# Patient Record
Sex: Female | Born: 1960 | Hispanic: No | Marital: Single | Smoking: Never smoker
Health system: Southern US, Community
[De-identification: ages and names within clinical notes are randomized; demographics above are authoritative.]

## PROBLEM LIST (undated history)

## (undated) DIAGNOSIS — R87619 Unspecified abnormal cytological findings in specimens from cervix uteri: Secondary | ICD-10-CM

## (undated) DIAGNOSIS — IMO0002 Reserved for concepts with insufficient information to code with codable children: Secondary | ICD-10-CM

## (undated) HISTORY — DX: Unspecified abnormal cytological findings in specimens from cervix uteri: R87.619

## (undated) HISTORY — DX: Reserved for concepts with insufficient information to code with codable children: IMO0002

---

## 2002-05-17 ENCOUNTER — Other Ambulatory Visit: Admission: RE | Admit: 2002-05-17 | Discharge: 2002-05-17 | Payer: Self-pay | Admitting: Gynecology

## 2003-05-25 ENCOUNTER — Other Ambulatory Visit: Admission: RE | Admit: 2003-05-25 | Discharge: 2003-05-25 | Payer: Self-pay | Admitting: Gynecology

## 2004-05-27 ENCOUNTER — Other Ambulatory Visit: Admission: RE | Admit: 2004-05-27 | Discharge: 2004-05-27 | Payer: Self-pay | Admitting: Gynecology

## 2005-05-28 ENCOUNTER — Other Ambulatory Visit: Admission: RE | Admit: 2005-05-28 | Discharge: 2005-05-28 | Payer: Self-pay | Admitting: Gynecology

## 2006-05-29 ENCOUNTER — Other Ambulatory Visit: Admission: RE | Admit: 2006-05-29 | Discharge: 2006-05-29 | Payer: Self-pay | Admitting: Gynecology

## 2011-08-19 ENCOUNTER — Ambulatory Visit (HOSPITAL_COMMUNITY)
Admission: RE | Admit: 2011-08-19 | Discharge: 2011-08-19 | Disposition: A | Discharge status: Home / Self Care | Payer: Medicaid Other | Source: Ambulatory Visit | Attending: Obstetrics and Gynecology | Admitting: Obstetrics and Gynecology

## 2011-08-19 ENCOUNTER — Encounter (HOSPITAL_COMMUNITY): Payer: Self-pay

## 2011-08-19 ENCOUNTER — Other Ambulatory Visit: Payer: Self-pay | Admitting: Obstetrics and Gynecology

## 2011-08-19 ENCOUNTER — Ambulatory Visit (INDEPENDENT_AMBULATORY_CARE_PROVIDER_SITE_OTHER): Payer: Self-pay | Admitting: *Deleted

## 2011-08-19 VITALS — BP 125/89 | HR 69 | Temp 97.2°F | Resp 18

## 2011-08-19 DIAGNOSIS — Z1231 Encounter for screening mammogram for malignant neoplasm of breast: Secondary | ICD-10-CM

## 2011-08-19 DIAGNOSIS — Z01419 Encounter for gynecological examination (general) (routine) without abnormal findings: Secondary | ICD-10-CM

## 2011-08-19 DIAGNOSIS — Z124 Encounter for screening for malignant neoplasm of cervix: Secondary | ICD-10-CM

## 2011-08-19 NOTE — Patient Instructions (Signed)
Gave patient education materials on Pap Test and a shower card showing her how to do a BSE. Went over the Insurance underwriter. Taught patient how to perform BSE and when the best time do it. Taught patient what to look for and when to call her physician. Patient verbalized understanding.  Patient was escorted upstairs to mammography to have her mammogram. Told patient will call with results and also will send a letter wither her results. Patient verbalized understanding.

## 2011-08-19 NOTE — Progress Notes (Signed)
No complaints today.  Pap Smear:    Pap Smear Completed. Last Pap was in spring of 2007 per patient. Pap Smear Result was normal per patient.   Physical exam: Breasts      Breasts are symmetrical. No skin abnormality, no nipple retraction, and no nipple discharge observed on exam. No lymphadenopathy, no lumps, and no thickening on palpation.      Pelvic/Bimanual   Ext Genitalia      No lesions, no swelling and no discharge observed on exam.     Vagina    Vagina pink and of normal color. Normal vaginal texture. Vaginal dryness observed on exam. Small amount of spotting red blood after exam.       Cervix      Cervix Present and to the left. Color and texture normal.        Uterus      Uterus Present and palpable. Uterus retroverted and of normal size.      Adnexae      Ovaries Present and not palpable. No tenderness on palpation.    Rectovaginal      Skin normal appearing and no masses observed on visual exam. Rectal exam not completed.

## 2011-08-29 ENCOUNTER — Telehealth: Payer: Self-pay | Admitting: *Deleted

## 2011-08-29 NOTE — Telephone Encounter (Signed)
Called pt and discussed her abnormal pap result and need for colpo. All pt's questions answered. Procedure and appt information given. Pt voiced understanding.

## 2011-08-29 NOTE — Telephone Encounter (Signed)
Message copied by Jill Side on Fri Aug 29, 2011 12:03 PM ------      Message from: Elwin Sleight A      Created: Thu Aug 28, 2011  7:42 AM      Regarding: FW: Needs colpo       This pt has an appt on 10/02/11 @ 1:45 foe a colpo. Please call pt, and answer any questions she may have.                  Thank you,       Antoinette                  ----- Message -----         From: Raynald Blend         Sent: 08/28/2011   7:05 AM           To: Juliette Mangle, RN      Subject: RE: Needs colpo                                          Ok            ----- Message -----         From: Juliette Mangle, RN         Sent: 08/27/2011   2:55 PM           To: Catalina Antigua, MD, [facility] Admin Pool      Subject: FW: Needs colpo                                          Please schedule colpo appt for this patient.      ----- Message -----         From: Catalina Antigua, MD         Sent: 08/27/2011  11:11 AM           To: Juliette Mangle, RN, #      Subject: Needs colpo                                              Hello,            This patient needs a colposcopy appointment

## 2011-08-29 NOTE — Telephone Encounter (Signed)
Called patient this morning to discuss mammogram and pap smear results. Patient's mammogram was normal and pap smear was abnormal. Talked with patient about the HPV virus. Told patient that a colposcopy is needed per Dr. Jolayne Panther for follow up. Explained the procedure to patient. Gave patient her follow up appointment at the [facility] for October 02, 2011 at 1:45pm with Dr. Natale Milch. Told patient this would be covered through [facility] and to show her pink [facility] card. Let patient know if she has any questions prior or after her appointment to call me. Patient stated she will keep the appointment. Patient verbalized understanding.

## 2011-10-02 ENCOUNTER — Ambulatory Visit (INDEPENDENT_AMBULATORY_CARE_PROVIDER_SITE_OTHER): Payer: Medicaid Other | Admitting: Family Medicine

## 2011-10-02 ENCOUNTER — Other Ambulatory Visit (HOSPITAL_COMMUNITY)
Admission: RE | Admit: 2011-10-02 | Discharge: 2011-10-02 | Disposition: A | Discharge status: Home / Self Care | Payer: Medicaid Other | Source: Ambulatory Visit | Attending: Family Medicine | Admitting: Family Medicine

## 2011-10-02 ENCOUNTER — Encounter: Payer: Self-pay | Admitting: Family Medicine

## 2011-10-02 VITALS — BP 123/87 | HR 73 | Temp 97.8°F | Ht 62.0 in | Wt 135.1 lb

## 2011-10-02 DIAGNOSIS — R87611 Atypical squamous cells cannot exclude high grade squamous intraepithelial lesion on cytologic smear of cervix (ASC-H): Secondary | ICD-10-CM

## 2011-10-02 DIAGNOSIS — N870 Mild cervical dysplasia: Secondary | ICD-10-CM | POA: Insufficient documentation

## 2011-10-02 DIAGNOSIS — R87610 Atypical squamous cells of undetermined significance on cytologic smear of cervix (ASC-US): Secondary | ICD-10-CM

## 2011-10-02 MED ORDER — IBUPROFEN 600 MG PO TABS: 600.0000 mg | ORAL_TABLET | Freq: Four times a day (QID) | ORAL | Status: AC | PRN

## 2011-10-02 NOTE — Progress Notes (Signed)
Addended by: Delena Bali on: 10/02/2011 03:31 PM   Modules accepted: Orders

## 2011-10-02 NOTE — Progress Notes (Signed)
States took ibuprofen as instructed before visit- states it makes her feel anxious- states is sensitive to meds

## 2011-10-02 NOTE — Patient Instructions (Signed)
Colposcopy Care After Colposcopy is a procedure in which a special tool is used to magnify the surface of the cervix. A tissue sample (biopsy) may also be taken. This sample will be looked at for cervical cancer or other problems. After the test:  You may have some cramping.   Lie down for a few minutes if you feel lightheaded.    You may have some bleeding which should stop in a few days.  HOME CARE  Do not have sex or use tampons for 2 to 3 days or as told.   Only take medicine as told by your doctor.   Continue to take your birth control pills as usual.  Finding out the results of your test Ask when your test results will be ready. Make sure you get your test results. GET HELP RIGHT AWAY IF:  You are bleeding a lot or are passing blood clots.   You develop a fever of 100.4 F (38.9 C) or higher.   You have abnormal vaginal discharge.   You have cramps that do not go away with medicine.   You feel lightheaded, dizzy, or pass out (faint).  MAKE SURE YOU:   Understand these instructions.   Will watch your condition.   Will get help right away if you are not doing well or get worse.  Document Released: 05/12/2008 Document Revised: 08/06/2011 Document Reviewed: 05/12/2008 [facility] Patient Information 2012 [place], [place].                 Human papillomavirus (HPV) is the most common sexually transmitted infection (STI) and is highly contagious. HPV infections cause genital warts and cancers to the outlet of the womb (cervix), birth canal (vagina), opening of the birth canal (vulva), and anus. There are over 100 types of HPV. Four types of HPV are responsible for causing 70% of all cervical cancers. Ninety percent of anal cancers and genital warts are caused by HPV. Unless you have wart-like lesions in the throat or genital warts that you can see or feel, HPV usually does not cause symptoms. Therefore, people can be infected for long periods and pass it on to  others without knowing it. HPV in pregnancy usually does not cause a problem for the mother or baby. If the mother has genital warts, the baby rarely gets infected. When the HPV infection is found to be pre-cancerous on the cervix, vagina, or vulva, the mother will be followed closely during the pregnancy. Any needed treatment will be done after the baby is born. CAUSES   Having unprotected sex. HPV can be spread by oral, vaginal, or anal sexual activity.   Having several sex partners.   Having a sex partner who has other sex partners.   Having or having had another sexually transmitted infection.  SYMPTOMS   More than 90% of people carrying HPV cannot tell anything is wrong.   Wart-like lesions in the throat (from having oral sex).   Warts in the infected skin or mucous membranes.   Genital warts may itch, burn, or bleed.   Genital warts may be painful or bleed during sexual intercourse.  DIAGNOSIS   Genital warts are easily seen with the naked eye.   Currently, there is no FDA-approved test to detect HPV in males.   In females, a Pap test can show cells which are infected with HPV.   In females, a scope can be used to view the cervix (colposcopy). A colposcopy can be performed if the pelvic exam  or Pap test is abnormal.   In females, a sample of tissue may be removed (biopsy) during the colposcopy.  TREATMENT   Treatment of genital warts can include:   Podophyllin lotion or gel.   Bichloroacetic acid (BCA) or trichloroacetic acid (TCA).   Podofilox solution or gel.   Imiquimod cream.   Interferon injections.   Use of a probe to apply extreme cold (cryotherapy).   Application of an intense beam of light (laser treatment).   Use of a probe to apply extreme heat (electrocautery).   Surgery.   HPV of the cervix, vagina, or vulva can be treated with:   Cryotherapy.   Laser treatment.   Electrocautery.   Surgery.  Your caregiver will follow you closely  after you are treated. This is because the HPV can come back and may need treatment again. HOME CARE INSTRUCTIONS   Follow your caregiver's instructions regarding medications, Pap tests, and follow-up exams.   Do not touch or scratch the warts.   Do not treat genital warts with medication used for treating hand warts.   Tell your sex partner about your infection because he or she may also need treatment.   Do not have sex while you are being treated.   After treatment, use condoms during sex to prevent future infections.   Have only 1 sex partner.   Have a sex partner who does not have other sex partners.   Use over-the-counter creams for itching or irritation as directed by your caregiver.   Use over-the-counter or prescription medicines for pain, discomfort, or fever as directed by your caregiver.   Do not douche or use tampons during treatment of HPV.  PREVENTION   Talk to your caregiver about getting the HPV vaccines. These vaccines prevent some HPV infections and cancers. It is recommended that the vaccine be given to males and females between the age of 31 and 6 years old. It will not work if you already have HPV and it is not recommended for pregnant women. The vaccines are not recommended for pregnant women.   Call your caregiver if you think you are pregnant and have the HPV.   A PAP test is done to screen for cervical cancer.   The first PAP test should be done at age 34.   Between ages 3 and 79, PAP tests are repeated every 2 years.   Beginning at age 46, you are advised to have a PAP test every 3 years as long as your past 3 PAP tests have been normal.   Some women have medical problems that increase the chance of getting cervical cancer. Talk to your caregiver about these problems. It is especially important to talk to your caregiver if a new problem develops soon after your last PAP test. In these cases, your caregiver may recommend more frequent screening and Pap  tests.   The above recommendations are the same for women who have or have not gotten the vaccine for HPV (Human Papillomavirus).   If you had a hysterectomy for a problem that was not a cancer or a condition that could lead to cancer, then you no longer need Pap tests. However, even if you no longer need a PAP test, a regular exam is a good idea to make sure no other problems are starting.    If you are between ages 52 and 49, and you have had normal Pap tests going back 10 years, you no longer need Pap tests. However, even if  you no longer need a PAP test, a regular exam is a good idea to make sure no other problems are starting.   If you have had past treatment for cervical cancer or a condition that could lead to cancer, you need Pap tests and screening for cancer for at least 20 years after your treatment.   If Pap tests have been discontinued, risk factors (such as a new sexual partner)need to be re-assessed to determine if screening should be resumed.   Some women may need screenings more often if they are at high risk for cervical cancer.  SEEK MEDICAL CARE IF:   The treated skin becomes red, swollen or painful.   You have an oral temperature above 102 F (38.9 C).   You feel generally ill.   You feel lumps or pimple-like projections in and around your genital area.   You develop bleeding of the vagina or the treatment area.   You develop painful sexual intercourse.  Document Released: 02/14/2004 Document Revised: 08/06/2011 Document Reviewed: 02/03/2008 [facility] Patient Information 2012 [place], [place].

## 2011-10-02 NOTE — Progress Notes (Signed)
Colposcopy Procedure Note Patient is a 52-yo [place] Female who presents for first colpo ever in her life for first ever abnormal pap smear.   Indications: Pap smear 1 months ago showed: ASCUS with POSITIVE high risk HPV. The prior pap showed no abnormalities.  Prior cervical/vaginal disease: none.  Procedure Details  The risks and benefits of the procedure and Written informed consent obtained.  Speculum placed in vagina and excellent visualization of cervix achieved, cervix swabbed x 3 with acetic acid solution.  Findings: Cervix: visible lesion(s) at 5, 6 and 12 o'clock, acetowhite lesion(s) noted at 5, 6 and 12 o'clock, punctation noted at 5, 6 and 12 o'clock and HPV changes noted at 5, 6 and 12 o'clock; cervix swabbed with Lugol's solution, SCJ visualized - lesion at 5, 6 and 12 o'clock, endocervical curettage performed, cervical biopsies taken at 5, 6 and 12 o'clock, specimen labelled and sent to pathology and hemostasis achieved with Monsel's solution.  Vaginal inspection: vaginal colposcopy not performed. Vulvar colposcopy: vulvar colposcopy not performed.  Specimens: 5, 6 and 12 o'clock, ECC  Complications: bleeding controlled with Monsel's solution  Plan: Specimens labelled and sent to Pathology. Will base further treatment on Pathology findings. Treatment options discussed with patient. Post biopsy instructions given to patient. Return to discuss Pathology results in 2 weeks or next available appointment.

## 2011-10-03 ENCOUNTER — Telehealth: Payer: Self-pay | Admitting: *Deleted

## 2011-10-03 NOTE — Telephone Encounter (Signed)
Patient had called and left me a voicemail with questions in regards to her colposcopy yesterday afternoon. Called patient back and spoke with patient. Patient was wondering when she would get her results. Let her know that it takes a few days and would more than likely be next week that would have her results. She had questions about needed follow up. Let her know the follow up will depend on the results of the biopsies. Told patient I will call and follow up with her after the results have been received and the needed follow up has been ordered by Dr. Natale Milch. Told patient if has any additional questions to feel free to call me. Patient verbalized understanding.

## 2011-10-17 ENCOUNTER — Telehealth: Payer: Self-pay | Admitting: *Deleted

## 2011-10-17 ENCOUNTER — Telehealth: Payer: Self-pay

## 2011-10-17 NOTE — Telephone Encounter (Signed)
Patient returned phone call and left me voicemail in regards to results to biopsies. No one answered phone. Left voicemail for patient to call me back.

## 2011-10-17 NOTE — Telephone Encounter (Signed)
Called pt and left message that I was calling on behalf of the [facility] program and return our call to the clinics.  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  Pt has an appt scheduled with Clinics on 11/14/11 for results.

## 2011-10-20 ENCOUNTER — Telehealth: Payer: Self-pay | Admitting: *Deleted

## 2011-10-20 NOTE — Telephone Encounter (Signed)
Patient called and left voicemail in regards to results from colposcopy. Called patient back and gave patient results to biopsy and let her know she will need a repeat pap smear in 6 per our Medical Director of the [facility] program. Patient has a follow up appointment on December 7th at the [facility] let her know importance to keep that appointment. Patient verbalized understanding.

## 2011-11-14 ENCOUNTER — Encounter: Payer: Self-pay | Admitting: *Deleted

## 2011-11-14 ENCOUNTER — Ambulatory Visit (INDEPENDENT_AMBULATORY_CARE_PROVIDER_SITE_OTHER): Payer: Medicaid Other | Admitting: *Deleted

## 2011-11-14 VITALS — BP 114/80 | HR 74 | Temp 96.8°F

## 2011-11-14 DIAGNOSIS — R87610 Atypical squamous cells of undetermined significance on cytologic smear of cervix (ASC-US): Secondary | ICD-10-CM

## 2011-11-14 DIAGNOSIS — N870 Mild cervical dysplasia: Secondary | ICD-10-CM

## 2011-11-14 NOTE — Progress Notes (Signed)
  Subjective:    Patient ID: Tonya Holder, female    DOB: [birth year], 50 y.o.   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    Review of Systems     Objective:   Physical Exam: Deferred       Assessment & Plan:  Repeat Pap in 6 months Discussed usual course of HVP and need for future surveillance and testing.  Dorathy Kinsman 11/14/2011 9:39 AM

## 2011-11-14 NOTE — Patient Instructions (Signed)
Pt has booklet from previous visit.

## 2012-04-02 ENCOUNTER — Ambulatory Visit (INDEPENDENT_AMBULATORY_CARE_PROVIDER_SITE_OTHER): Payer: Medicaid Other | Admitting: Obstetrics & Gynecology

## 2012-04-02 ENCOUNTER — Other Ambulatory Visit (HOSPITAL_COMMUNITY)
Admission: RE | Admit: 2012-04-02 | Discharge: 2012-04-02 | Disposition: A | Discharge status: Home / Self Care | Payer: Medicaid Other | Source: Ambulatory Visit | Attending: Obstetrics & Gynecology | Admitting: Obstetrics & Gynecology

## 2012-04-02 ENCOUNTER — Encounter: Payer: Self-pay | Admitting: Obstetrics & Gynecology

## 2012-04-02 VITALS — BP 113/65 | HR 67 | Temp 97.6°F | Ht 63.0 in | Wt 133.6 lb

## 2012-04-02 DIAGNOSIS — Z0000 Encounter for general adult medical examination without abnormal findings: Secondary | ICD-10-CM

## 2012-04-02 DIAGNOSIS — R87610 Atypical squamous cells of undetermined significance on cytologic smear of cervix (ASC-US): Secondary | ICD-10-CM

## 2012-04-02 DIAGNOSIS — Z01419 Encounter for gynecological examination (general) (routine) without abnormal findings: Secondary | ICD-10-CM | POA: Insufficient documentation

## 2012-04-02 NOTE — Progress Notes (Signed)
Patient ID: Tonya Holder, female   DOB: [birth year], 51 y.o.   MRN: [id number] Subjective:    Tonya Holder is a 51 y.o. female who presents for an annual exam. The patient has no complaints today. The patient is not currently sexually active. GYN screening history: last pap: was abnormal: ASCUS, 9/12. The patient wears seatbelts: yes. The patient participates in regular exercise: yes. Has the patient ever been transfused or tattooed?: no. The patient reports that there is not domestic violence in her life.   Menstrual History: OB History    Grav Para Term Preterm Abortions TAB SAB Ect Mult Living   3 1 1  2 2    1       Menarche age: 52 Patient's last menstrual period was 12/28/2011.    The following portions of the patient's history were reviewed and updated as appropriate: allergies, current medications, past family history, past medical history, past social history, past surgical history and problem list.  Review of Systems A comprehensive review of systems was negative. She works at [employer] in [employer]. She had a mammogram about 6 months ago.   Objective:    BP 113/65  Pulse 67  Temp(Src) 97.6 F (36.4 C) (Oral)  Ht 5\' 3"  (1.6 m)  Wt 133 lb 9.6 oz (60.601 kg)  BMI 23.67 kg/m2  LMP 12/28/2011  General Appearance:    Alert, cooperative, no distress, appears stated age  Head:    Normocephalic, without obvious abnormality, atraumatic  Eyes:    PERRL, conjunctiva/corneas clear, EOM's intact, fundi    benign, both eyes  Ears:    Normal TM's and external ear canals, both ears  Nose:   Nares normal, septum midline, mucosa normal, no drainage    or sinus tenderness  Throat:   Lips, mucosa, and tongue normal; teeth and gums normal  Neck:   Supple, symmetrical, trachea midline, no adenopathy;    thyroid:  no enlargement/tenderness/nodules; no carotid   bruit or JVD  Back:     Symmetric, no curvature, ROM normal, no CVA tenderness  Lungs:     Clear to auscultation  bilaterally, respirations unlabored  Chest Wall:    No tenderness or deformity   Heart:    Regular rate and rhythm, S1 and S2 normal, no murmur, rub   or gallop  Breast Exam:    No tenderness, masses, or nipple abnormality  Abdomen:     Soft, non-tender, bowel sounds active all four quadrants,    no masses, no organomegaly  Genitalia:    Normal female without lesion, discharge or tenderness, NSSR, NT, no adnexal masses     Extremities:   Extremities normal, atraumatic, no cyanosis or edema  Pulses:   2+ and symmetric all extremities  Skin:   Skin color, texture, turgor normal, no rashes or lesions  Lymph nodes:   Cervical, supraclavicular, and axillary nodes normal  Neurologic:   CNII-XII intact, normal strength, sensation and reflexes    throughout  .    Assessment:    Healthy female exam.    Plan:     Pap smear.

## 2012-04-02 NOTE — Progress Notes (Signed)
Addended by: Lynnell Dike on: 04/02/2012 11:27 AM   Modules accepted: Orders

## 2012-04-07 ENCOUNTER — Telehealth: Payer: Self-pay | Admitting: *Deleted

## 2012-04-07 NOTE — Telephone Encounter (Signed)
Message copied by Mannie Stabile on Wed Apr 07, 2012 11:37 AM ------      Message from: Elwin Sleight A      Created: Tue Apr 06, 2012 11:22 AM       Pt has appt 05/12/12 @ 1:45. Please inform pt [facility] will cover this visit.                        ----- Message -----         From: Drucilla Schmidt Day, RN         Sent: 04/06/2012  10:35 AM           To: [facility] Admin Pool            Please schedule colpo and than return to clinical pool to contact pt. Thanks       ----- Message -----         From: Allie Bossier, MD         Sent: 04/06/2012   9:58 AM           To: Drucilla Schmidt Day, RN            She needs a colposcopy scheduled please.

## 2012-04-07 NOTE — Telephone Encounter (Signed)
Attempted to call patient, got a recording stating that patient has a voicemail box that has not been set up.

## 2012-04-08 NOTE — Telephone Encounter (Signed)
Called and informed pt that she had an abnormal pap in which she has an appt for a colpo.  Pt stated that she needed an earlier appt time so her appt is now scheduled for June 7 @ 0815. Pt agreed to the appt and stated that she would be here.  I advised pt to write and bring in any questions that she may have to her appt.  Pt stated understanding and had no further questions.

## 2012-05-12 ENCOUNTER — Telehealth: Payer: Self-pay | Admitting: *Deleted

## 2012-05-12 ENCOUNTER — Encounter: Payer: Medicaid Other | Admitting: Family Medicine

## 2012-05-12 NOTE — Telephone Encounter (Signed)
Patient called and left me two voicemails. Attempted to call patient back. No one answered phone and her voicemail box is not set up.

## 2012-05-14 ENCOUNTER — Ambulatory Visit (INDEPENDENT_AMBULATORY_CARE_PROVIDER_SITE_OTHER): Payer: Medicaid Other | Admitting: Obstetrics and Gynecology

## 2012-05-14 ENCOUNTER — Encounter: Payer: Self-pay | Admitting: Obstetrics and Gynecology

## 2012-05-14 ENCOUNTER — Other Ambulatory Visit (HOSPITAL_COMMUNITY)
Admission: RE | Admit: 2012-05-14 | Discharge: 2012-05-14 | Disposition: A | Discharge status: Home / Self Care | Payer: Medicaid Other | Source: Ambulatory Visit | Attending: Obstetrics and Gynecology | Admitting: Obstetrics and Gynecology

## 2012-05-14 VITALS — BP 120/83 | HR 69 | Temp 97.0°F | Ht 62.0 in | Wt 132.8 lb

## 2012-05-14 DIAGNOSIS — Z01812 Encounter for preprocedural laboratory examination: Secondary | ICD-10-CM

## 2012-05-14 DIAGNOSIS — N870 Mild cervical dysplasia: Secondary | ICD-10-CM | POA: Insufficient documentation

## 2012-05-14 DIAGNOSIS — R87612 Low grade squamous intraepithelial lesion on cytologic smear of cervix (LGSIL): Secondary | ICD-10-CM

## 2012-05-14 HISTORY — PX: COLPOSCOPY: SHX161

## 2012-05-14 NOTE — Patient Instructions (Signed)
Colposcopy Care After Colposcopy is a procedure in which a special tool is used to magnify the surface of the cervix. A tissue sample (biopsy) may also be taken. This sample will be looked at for cervical cancer or other problems. After the test:  You may have some cramping.   Lie down for a few minutes if you feel lightheaded.    You may have some bleeding which should stop in a few days.  HOME CARE  Do not have sex or use tampons for 2 to 3 days or as told.   Only take medicine as told by your doctor.   Continue to take your birth control pills as usual.  Finding out the results of your test Ask when your test results will be ready. Make sure you get your test results. GET HELP RIGHT AWAY IF:  You are bleeding a lot or are passing blood clots.   You develop a fever of 100.4 F (38 C) or higher.   You have abnormal vaginal discharge.   You have cramps that do not go away with medicine.   You feel lightheaded, dizzy, or pass out (faint).  MAKE SURE YOU:   Understand these instructions.   Will watch your condition.   Will get help right away if you are not doing well or get worse.  Document Released: 05/12/2008 Document Revised: 11/13/2011 Document Reviewed: 05/12/2008 [facility] Patient Information 2012 [place], [place].

## 2012-05-14 NOTE — Progress Notes (Signed)
  Subjective:    Patient ID: Tonya Holder, female    DOB: [birth year], 51 y.o.   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  Patient given informed consent, signed copy in the chart, time out was performed.  Placed in lithotomy position. Cervix viewed with speculum and colposcope after application of acetic acid.   Colposcopy adequate?  yes Acetowhite lesions?yes from 4 to 7 o'clock Punctation?no Mosaicism?  no Abnormal vasculature?  no Biopsies?yes  ECC?yes  COMMENTS: Patient was given post procedure instructions.  She will return in 2 weeks for results.

## 2012-05-14 NOTE — Progress Notes (Signed)
Patient ID: Tonya Holder, female   DOB: [birth year], 51 y.o.   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

## 2012-05-19 ENCOUNTER — Telehealth: Payer: Self-pay | Admitting: *Deleted

## 2012-05-19 NOTE — Telephone Encounter (Signed)
Message copied by Jill Side on Wed May 19, 2012 10:23 AM ------      Message from: CONSTANT, PEGGY      Created: Wed May 19, 2012  9:41 AM       Please contact patient and inform of persistently abnormal colpo result and need for cryotherapy. Please schedule next appointment for cryotherapy of cervix for her            Gigi Gin

## 2012-05-19 NOTE — Telephone Encounter (Signed)
Called pt and informed her of colpo results as well as recommendation from Dr. Jolayne Panther.  I answered pt's questions to her satisfaction and informed her that someone will call her with an appt for the cryo procedure. Pt voiced understanding.

## 2012-05-21 ENCOUNTER — Ambulatory Visit: Payer: Medicaid Other | Admitting: Obstetrics and Gynecology

## 2012-05-25 ENCOUNTER — Telehealth: Payer: Self-pay | Admitting: *Deleted

## 2012-05-25 NOTE — Telephone Encounter (Signed)
Telephoned patient at home # and unable to leave message due to voicemail not being set up. Need to schedule time to do [facility] paperwork.

## 2012-06-17 ENCOUNTER — Encounter: Payer: Self-pay | Admitting: Obstetrics and Gynecology

## 2012-06-17 ENCOUNTER — Ambulatory Visit (INDEPENDENT_AMBULATORY_CARE_PROVIDER_SITE_OTHER): Payer: Self-pay | Admitting: Obstetrics and Gynecology

## 2012-06-17 VITALS — BP 131/81 | HR 68 | Temp 97.5°F | Ht 62.0 in | Wt 129.2 lb

## 2012-06-17 DIAGNOSIS — IMO0002 Reserved for concepts with insufficient information to code with codable children: Secondary | ICD-10-CM

## 2012-06-17 DIAGNOSIS — Z01812 Encounter for preprocedural laboratory examination: Secondary | ICD-10-CM

## 2012-06-17 DIAGNOSIS — R87612 Low grade squamous intraepithelial lesion on cytologic smear of cervix (LGSIL): Secondary | ICD-10-CM

## 2012-06-17 NOTE — Progress Notes (Signed)
Cryotherapy details The indications for cryotherapy were reviewed with the patient in detail. She was counseled about that efficacy of this procedure, and possible need for excisional procedure in the future if her cervical dysplasia persists.  The risks of the procedure where explained in detail and patient was told to expect a copious amount of discharge in the next few weeks. All her questions were answered, and written informed consent was obtained.  The patient was placed in the dorsal lithotomy position and a vaginal speculum was placed. Her cervix was visualized and patient was noted to have had normal size transformation zone. The appropriate cryotherapy probe was picked and affixed to cryotherapy apparatus. Then nitrogen gas was then activated, the probe was coated with lubricating jelly and applied to the transformation zone of the cervix. This was kept in place for 3 minutes. The cryotherapy was then stopped and all instruments were removed from the patient's pelvis; a thawing period of 3 minutes was observed.  A second cycle of cryotherapy was then administered to the cervix for 3 minutes.  The patient tolerated the procedure well without any complications. Routine post procedure instructions were given to the patient.  Will follow up results and manage accordingly to [facility] guidelines which specify co-testing with pap and HPV typing at 12 and 24 months.  

## 2012-06-17 NOTE — Patient Instructions (Signed)
Cervical cryotherapy is a procedure which involves freezing an area of abnormal tissue on the cervix. This tissue gradually disappears and the cervix heals. One cervical cryotherapy is usually sufficient to destroy the abnormal tissue.  Purpose  Cervical cryotherapy is a standard method used to treat cervical dysplasia, meaning the removal of abnormal cell tissue on the cervix.  Description  Cervical cryotherapy, or freezing, usually lasts about five minutes and causes a slight amount of discomfort. The procedure is usually performed in an outpatient setting.  Cervical cryotherapy is done by placing a small freeze-probe (cryoprobe) against the cervix that cools the cervix to sub-zero temperatures. The cells destroyed by freezing are shed afterwards in a heavy watery discharge. The main advantage of cryotherapy is that it is a simple procedure that requires inexpensive equipment.  The cryogenic device consists of a gas tank containing a refrigerant and non-explosive, non-toxic gas (usually nitrous oxide). The gas is delivered using flexible tubing through a gun-type attachment to the cryoprobe.  Diagnosis/Preparation  Women who undergo cervical cryotherapy typically have had an abnormal Pap smear which has led to a diagnosis of cervical squamous dysplasia and usually confirmed by biopsy after an adequate colposcopic exam.  Preparation for cervical cryotherapy involves scheduling the procedure when the patient is not experiencing heavy menstrual flow. Ibuprofen, ketoprofen, or naproxen sodium may be given before cryotherapy to decrease cramping. If there is any doubt about the pregnancy status, a pregnancy test is performed.  Aftercare  Cervical cryotherapy is often followed by a heavy and often odorous discharge during the first month after the procedure. The discharge is due to the dead tissue cells leaving the treatment site, and Aminocerv cream may be prescribed. The patient should abstain from sexual  intercourse and not use tampons for a period of three weeks after the procedure. Excessive exercise should also be avoided to lessen the occurrence of post-therapy bleeding.  Risks  The following risks have been associated with cervical cryotherapy:  Uterine cramping. Often occurs during the cryotherapy but rapidly subsides after treatment.  Bleeding and infection. Rare, but incidences have been reported.  More difficult Pap smears. Future Pap smears and colposcopy may be more difficult after cryotherapy.  Normal results  A normal result is no recurrence of the abnormal cervix cells. The first follow-up Pap smear is done within three to six months. If normal, Pap smears are repeated every six months for two years. If any, recurrences usually occur within two years of treatment. Another option is to replace the initial and each yearly Pap smear with a colposcopic examination.  If a follow-up Pap smear is abnormal, a colposcopy with biopsy is usually performed. Other treatment methods, usually the loop electrocautery excision procedure (LEEP) are then used if persistent disease is discovered.  Following the procedure, it is considered normal to experience the following:  slight cramping for two to three days  watery discharge requiring several pad changes daily  bloody discharge, especially 12-16 days after the procedure  Alternatives  Loop electrocautery excision procedure (LEEP). This procedure uses a fine wire loop with an electric current flowing through it to remove the desired area of the cervix. Loop excision is usually done under local anesthesia and causes very little discomfort.   

## 2012-08-19 ENCOUNTER — Other Ambulatory Visit: Payer: Self-pay | Admitting: Obstetrics and Gynecology

## 2012-08-19 DIAGNOSIS — Z1231 Encounter for screening mammogram for malignant neoplasm of breast: Secondary | ICD-10-CM

## 2012-08-23 ENCOUNTER — Ambulatory Visit (HOSPITAL_COMMUNITY)
Admission: RE | Admit: 2012-08-23 | Discharge: 2012-08-23 | Disposition: A | Discharge status: Home / Self Care | Payer: Medicaid Other | Source: Ambulatory Visit | Attending: Obstetrics and Gynecology | Admitting: Obstetrics and Gynecology

## 2012-08-23 DIAGNOSIS — Z1231 Encounter for screening mammogram for malignant neoplasm of breast: Secondary | ICD-10-CM

## 2012-08-25 ENCOUNTER — Other Ambulatory Visit: Payer: Self-pay | Admitting: Obstetrics and Gynecology

## 2012-08-25 DIAGNOSIS — R928 Other abnormal and inconclusive findings on diagnostic imaging of breast: Secondary | ICD-10-CM

## 2012-09-07 ENCOUNTER — Other Ambulatory Visit: Payer: Self-pay | Admitting: Obstetrics and Gynecology

## 2012-09-07 DIAGNOSIS — Z1231 Encounter for screening mammogram for malignant neoplasm of breast: Secondary | ICD-10-CM

## 2012-09-20 ENCOUNTER — Encounter (HOSPITAL_COMMUNITY): Payer: Self-pay | Admitting: *Deleted

## 2012-09-28 ENCOUNTER — Ambulatory Visit (HOSPITAL_COMMUNITY): Payer: Self-pay

## 2012-10-08 ENCOUNTER — Other Ambulatory Visit: Payer: Self-pay | Admitting: Family Medicine

## 2012-10-08 ENCOUNTER — Ambulatory Visit
Admission: RE | Admit: 2012-10-08 | Discharge: 2012-10-08 | Disposition: A | Discharge status: Home / Self Care | Payer: PRIVATE HEALTH INSURANCE | Source: Ambulatory Visit | Attending: Family Medicine | Admitting: Family Medicine

## 2012-10-08 DIAGNOSIS — R42 Dizziness and giddiness: Secondary | ICD-10-CM

## 2012-10-08 DIAGNOSIS — R51 Headache: Secondary | ICD-10-CM

## 2012-10-18 ENCOUNTER — Encounter (HOSPITAL_COMMUNITY): Payer: Self-pay | Admitting: *Deleted

## 2012-11-02 ENCOUNTER — Ambulatory Visit (HOSPITAL_COMMUNITY)
Admission: RE | Admit: 2012-11-02 | Discharge: 2012-11-02 | Disposition: A | Discharge status: Home / Self Care | Payer: PRIVATE HEALTH INSURANCE | Source: Ambulatory Visit | Attending: Obstetrics and Gynecology | Admitting: Obstetrics and Gynecology

## 2012-11-02 ENCOUNTER — Encounter (HOSPITAL_COMMUNITY): Payer: Self-pay

## 2012-11-02 VITALS — BP 110/76 | Temp 97.6°F | Ht 62.25 in | Wt 133.2 lb

## 2012-11-02 DIAGNOSIS — Z1239 Encounter for other screening for malignant neoplasm of breast: Secondary | ICD-10-CM

## 2012-11-02 DIAGNOSIS — Z01419 Encounter for gynecological examination (general) (routine) without abnormal findings: Secondary | ICD-10-CM

## 2012-11-02 NOTE — Progress Notes (Signed)
Patient referred to [facility] from the [facility] for additional imaging of the right breast. Screening mammogram completed 08/23/12 at [facility].  Pap Smear:    Pap smear not performed today. Patients last Pap smear was 04/02/2012 at [facility] and LSIL. Patient's prior Pap smear completed through [facility] 08/19/2011 was ASCUS HPV+. Patient had colposcopies completed after both Pap smears. Patient had cryo completed 06/17/2012. Patient recommended to have next Pap smear 12 months after cryo. Pap smear results above is in EPIC.  Physical exam: Breasts Breasts symmetrical. No skin abnormalities bilateral breasts. No nipple retraction bilateral breasts. No nipple discharge bilateral breasts. No lymphadenopathy. No lumps palpated bilateral breasts. No complaints of pain or tenderness on exam. Patient referred to the [facility] per recommendation for right breast Diagnostic Mammogram and possible right breast ultrasound. Appointment scheduled for Wednesday, November 10, 2012 at 0740.        Pelvic/Bimanual No Pap smear completed today since last Pap smear was 04/02/2012. Pap smear not indicated per [facility] guidelines.

## 2012-11-02 NOTE — Patient Instructions (Signed)
Taught patient how to perform BSE. Patient did not need a Pap smear today due to last Pap smear was 04/02/2012. Patient had cryo completed 06/17/2012 and recommendation is for Pap smear and HPV typing 12 months after cryo. Told patient we will contact her to schedule follow up Pap smear and HPV typing through [facility]. Patient referred to the [facility] per recommendation for right breast Diagnostic Mammogram and possible right breast ultrasound. Appointment scheduled for Wednesday, November 10, 2012 at 0740. Patient aware of appointment and will be there. Patient verbalized understanding.

## 2012-11-10 ENCOUNTER — Ambulatory Visit
Admission: RE | Admit: 2012-11-10 | Discharge: 2012-11-10 | Disposition: A | Discharge status: Home / Self Care | Payer: No Typology Code available for payment source | Source: Ambulatory Visit | Attending: Obstetrics and Gynecology | Admitting: Obstetrics and Gynecology

## 2012-11-10 DIAGNOSIS — R928 Other abnormal and inconclusive findings on diagnostic imaging of breast: Secondary | ICD-10-CM

## 2012-12-09 NOTE — Addendum Note (Signed)
Encounter addended by: Saintclair Halsted, RN on: 12/09/2012  4:38 PM<BR>     Documentation filed: Charges VN

## 2013-01-03 ENCOUNTER — Other Ambulatory Visit: Payer: Self-pay | Admitting: Neurology

## 2013-01-03 DIAGNOSIS — S139XXA Sprain of joints and ligaments of unspecified parts of neck, initial encounter: Secondary | ICD-10-CM

## 2013-01-03 DIAGNOSIS — R209 Unspecified disturbances of skin sensation: Secondary | ICD-10-CM

## 2013-01-03 DIAGNOSIS — S060X0A Concussion without loss of consciousness, initial encounter: Secondary | ICD-10-CM

## 2013-01-07 ENCOUNTER — Ambulatory Visit
Admission: RE | Admit: 2013-01-07 | Discharge: 2013-01-07 | Disposition: A | Discharge status: Home / Self Care | Payer: PRIVATE HEALTH INSURANCE | Source: Ambulatory Visit | Attending: Neurology | Admitting: Neurology

## 2013-01-07 DIAGNOSIS — S139XXA Sprain of joints and ligaments of unspecified parts of neck, initial encounter: Secondary | ICD-10-CM

## 2013-01-07 DIAGNOSIS — S060X0A Concussion without loss of consciousness, initial encounter: Secondary | ICD-10-CM

## 2013-01-07 DIAGNOSIS — R209 Unspecified disturbances of skin sensation: Secondary | ICD-10-CM

## 2013-02-04 ENCOUNTER — Encounter: Payer: Self-pay | Admitting: Neurology

## 2013-02-04 DIAGNOSIS — R209 Unspecified disturbances of skin sensation: Secondary | ICD-10-CM

## 2013-02-04 DIAGNOSIS — S060X0A Concussion without loss of consciousness, initial encounter: Secondary | ICD-10-CM | POA: Insufficient documentation

## 2013-02-04 DIAGNOSIS — S139XXA Sprain of joints and ligaments of unspecified parts of neck, initial encounter: Secondary | ICD-10-CM | POA: Insufficient documentation

## 2013-06-07 ENCOUNTER — Ambulatory Visit (HOSPITAL_COMMUNITY)
Admission: RE | Admit: 2013-06-07 | Discharge: 2013-06-07 | Disposition: A | Discharge status: Home / Self Care | Payer: PRIVATE HEALTH INSURANCE | Source: Ambulatory Visit | Attending: Obstetrics and Gynecology | Admitting: Obstetrics and Gynecology

## 2013-06-07 ENCOUNTER — Encounter (HOSPITAL_COMMUNITY): Payer: Self-pay

## 2013-06-07 VITALS — BP 118/74 | Temp 98.1°F | Ht 63.0 in | Wt 130.6 lb

## 2013-06-07 DIAGNOSIS — IMO0002 Reserved for concepts with insufficient information to code with codable children: Secondary | ICD-10-CM

## 2013-06-07 NOTE — Patient Instructions (Addendum)
Let patient know will follow up with her within the next couple weeks with results by phone. Told patient if Pap smear is normal her next Pap smear is due in one year due to her recent history of abnormal Pap smears. If the results are abnormal follow up will be based on result. Tonya Holder verbalized understanding.  Brannock, [name], RN 8:56 AM

## 2013-06-07 NOTE — Progress Notes (Addendum)
No complaints today.  Pap Smear:    Pap smear completed today. Patients last Pap smear was 04/02/2012 at the [facility] and LSIL. 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 Patient's prior Pap smear 08/19/2011 at [facility] was ASCUS HPV+. Pap smear results above are in EPIC.        Pelvic/Bimanual   Ext Genitalia No lesions, no swelling and no discharge observed on external genitalia.         Vagina Vagina pink and normal texture. No lesions or discharge observed in vagina.          Cervix Cervix is present. Cervix pink and of normal texture. No discharge observed.     Uterus Uterus is present and palpable. Uterus in normal position and normal size.        Adnexae Bilateral ovaries present and palpable. No tenderness on palpation.          Rectovaginal No rectal exam completed today since patient had no rectal complaints. No skin abnormalities observed on exam.

## 2013-06-16 ENCOUNTER — Telehealth (HOSPITAL_COMMUNITY): Payer: Self-pay | Admitting: *Deleted

## 2013-06-16 NOTE — Telephone Encounter (Signed)
Telephoned patient at home # and left message to return call to [facility] 

## 2013-10-13 ENCOUNTER — Other Ambulatory Visit: Payer: Self-pay | Admitting: Obstetrics and Gynecology

## 2013-10-13 ENCOUNTER — Other Ambulatory Visit: Payer: Self-pay

## 2013-10-13 DIAGNOSIS — Z1231 Encounter for screening mammogram for malignant neoplasm of breast: Secondary | ICD-10-CM

## 2013-11-22 ENCOUNTER — Encounter (HOSPITAL_COMMUNITY): Payer: Self-pay

## 2013-11-22 ENCOUNTER — Ambulatory Visit (HOSPITAL_COMMUNITY)
Admission: RE | Admit: 2013-11-22 | Discharge: 2013-11-22 | Disposition: A | Discharge status: Home / Self Care | Payer: PRIVATE HEALTH INSURANCE | Source: Ambulatory Visit | Attending: Obstetrics and Gynecology | Admitting: Obstetrics and Gynecology

## 2013-11-22 ENCOUNTER — Ambulatory Visit (HOSPITAL_COMMUNITY)
Admission: RE | Admit: 2013-11-22 | Discharge: 2013-11-22 | Disposition: A | Discharge status: Home / Self Care | Payer: Medicaid Other | Source: Ambulatory Visit | Attending: Obstetrics and Gynecology | Admitting: Obstetrics and Gynecology

## 2013-11-22 VITALS — BP 102/64 | Temp 97.8°F | Ht 63.0 in | Wt 138.4 lb

## 2013-11-22 DIAGNOSIS — Z1239 Encounter for other screening for malignant neoplasm of breast: Secondary | ICD-10-CM

## 2013-11-22 DIAGNOSIS — Z1231 Encounter for screening mammogram for malignant neoplasm of breast: Secondary | ICD-10-CM

## 2013-11-22 NOTE — Progress Notes (Signed)
No complaints today.  Pap Smear:    Pap smear not completed today. Patients last Pap smear was 06/07/2013 at [facility] and normal. Her prior two Pap smears were abnormal. Her Pap smear 04/02/2012 was LSIL. A follow up colposcopy was completed 05/14/2012 showing CIN-I and cryotherapy was done for follow up. Patient's Pap smear 08/19/2011 at [facility] was ASCUS HPV+ and colpsocopy was completed 10/02/2011 showing CIN-I. Pap smear results above are in EPIC.   Physical exam: Breasts Breasts symmetrical. No skin abnormalities bilateral breasts. No nipple retraction bilateral breasts. No nipple discharge bilateral breasts. No lymphadenopathy. No lumps palpated bilateral breasts. No complaints of pain or tenderness on exam. Patient escorted to mammography for a screening mammogram.        Pelvic/Bimanual No Pap smear completed today since last Pap smear was June 07, 2013. Pap smear not indicated per [facility]CP guidelines.

## 2013-11-22 NOTE — Patient Instructions (Signed)
Taught Tonya Holder how to perform BSE. Patient did not need a Pap smear today due to last Pap smear was July 2014. Told patient her next Pap smear will be due in July 2015 due to her history of abnormal Pap smears. Let patient know will follow up with her within the next couple weeks with results by letter or phone. Ryleah Miramontes verbalized understanding. Patient escorted to mammography for a screening mammogram.  Lasean Gorniak, [name], RN 9:03 AM

## 2013-12-26 IMAGING — CT CT HEAD W/O CM
2 series · 16 of 30 positions shown, 20 images · non-contrast
Comparison: None.

CLINICAL DATA: Headaches and dizziness

CT HEAD WITHOUT CONTRAST
TECHNIQUE: Contiguous axial images were obtained from the base of
the skull through the vertex without contrast.

[Series 2: head w/o · axial · non-contrast · 0.49mm/px · z∈[-13,+111]mm · 13 of 28 slices shown, 17 images]
[im 2/28  brain]
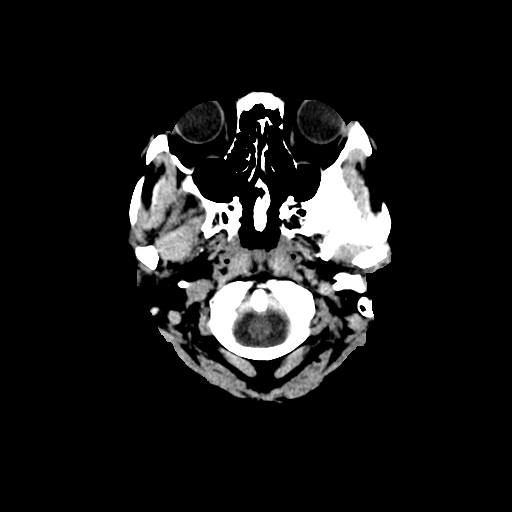
[im 2/28  bone]
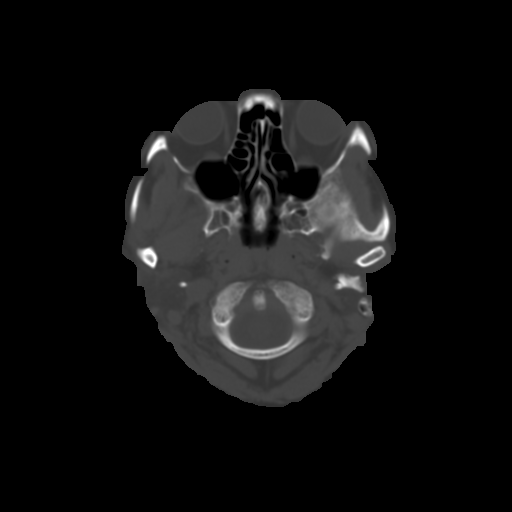
[im 4/28  brain]
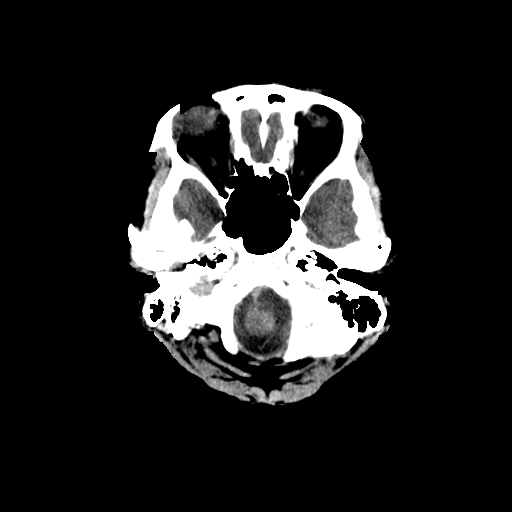
[im 6/28  brain]
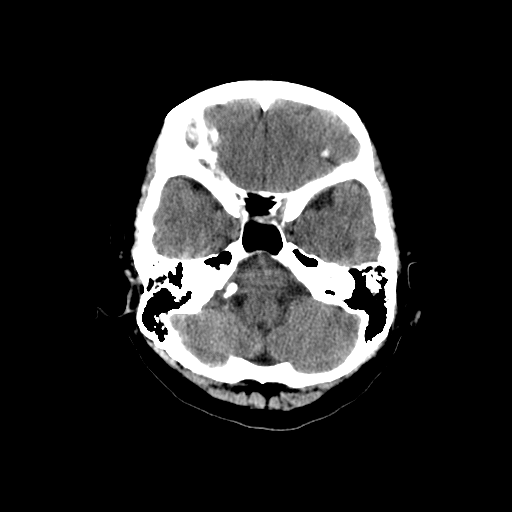
[im 8/28  brain]
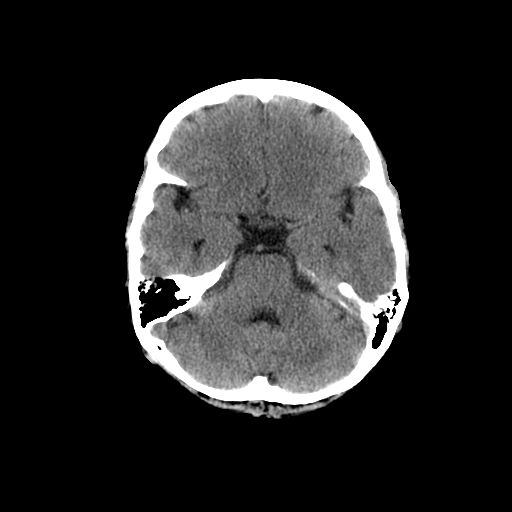
[im 10/28  brain]
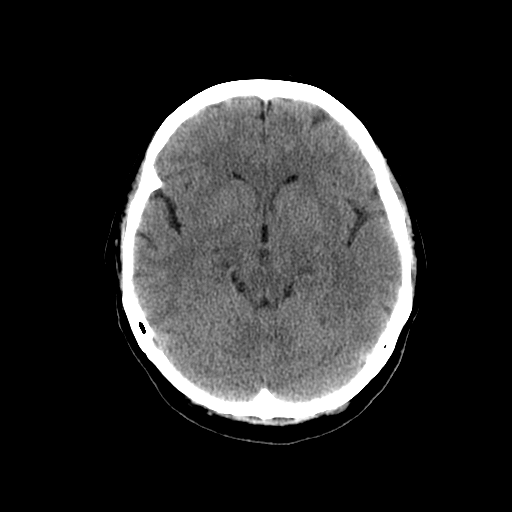
[im 10/28  bone]
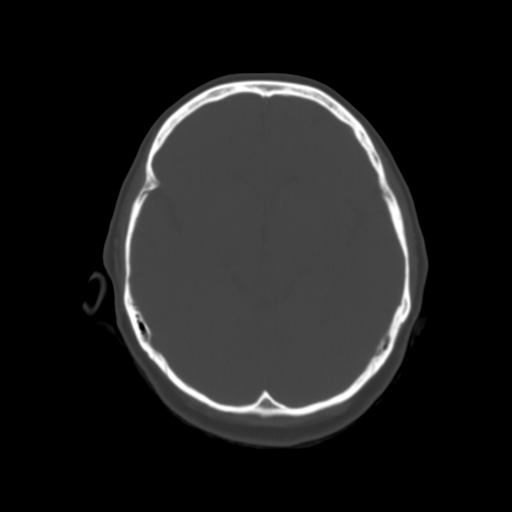
[im 12/28  brain]
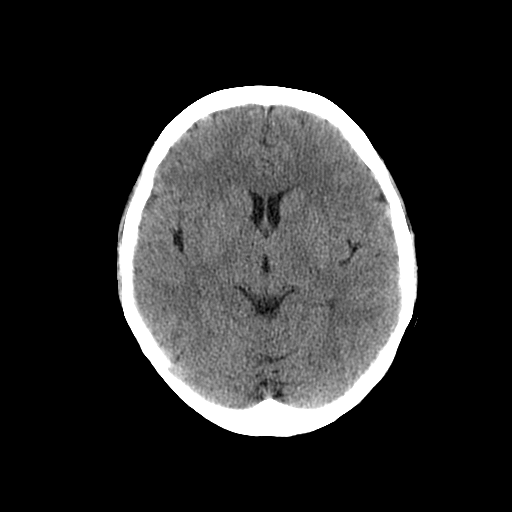
[im 14/28  brain]
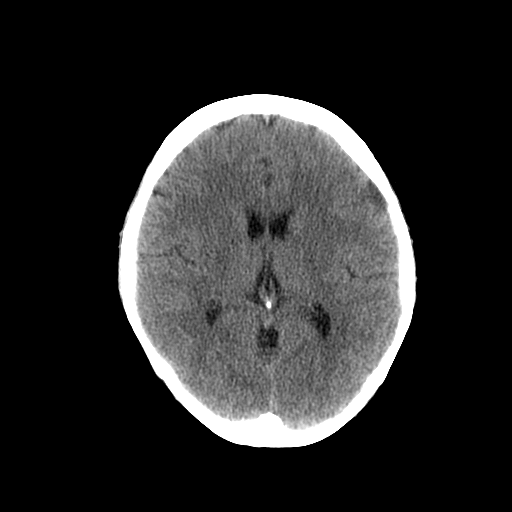
[im 16/28  brain]
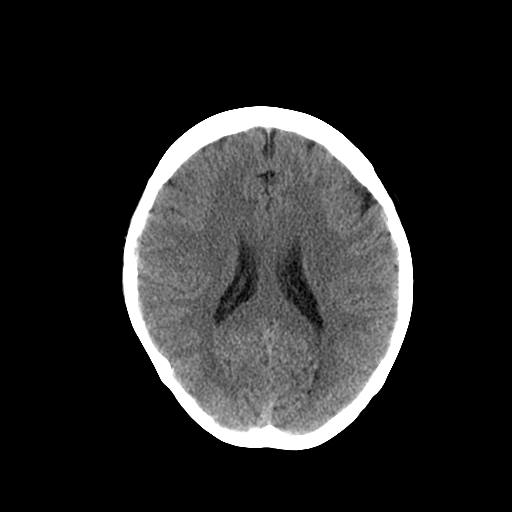
[im 18/28  brain]
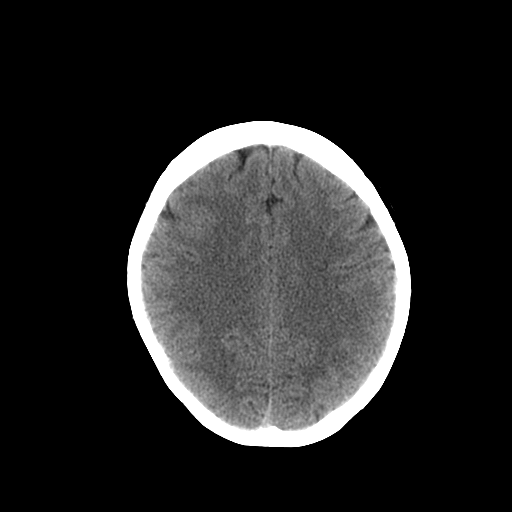
[im 18/28  bone]
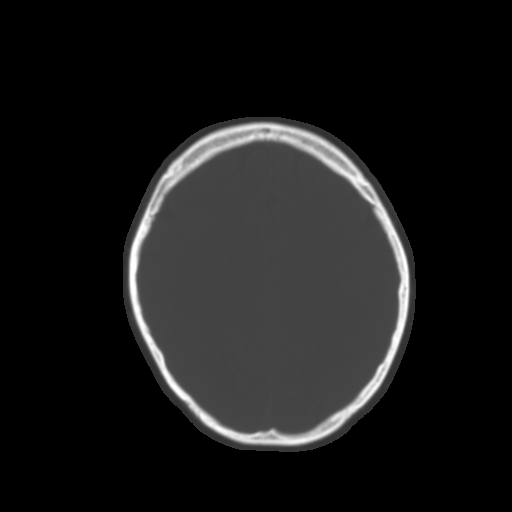
[im 20/28  brain]
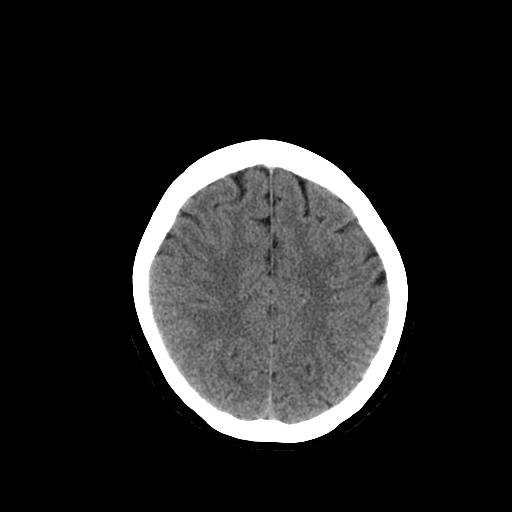
[im 22/28  brain]
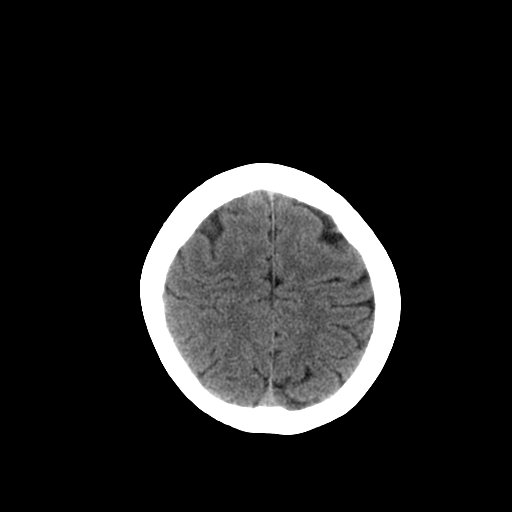
[im 24/28  brain]
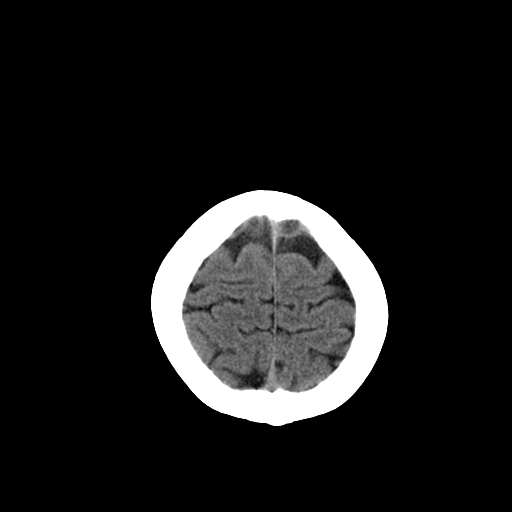
[im 26/28  brain]
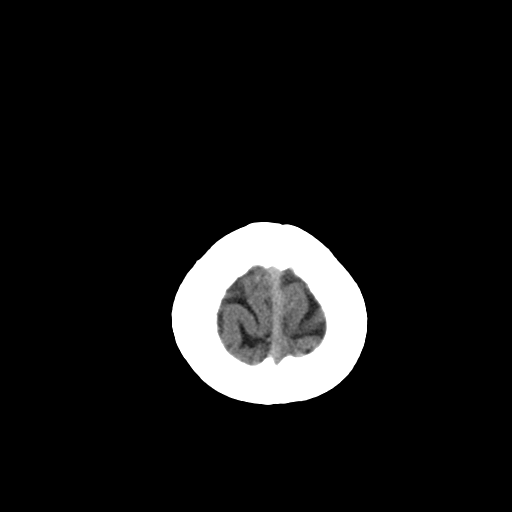
[im 26/28  bone]
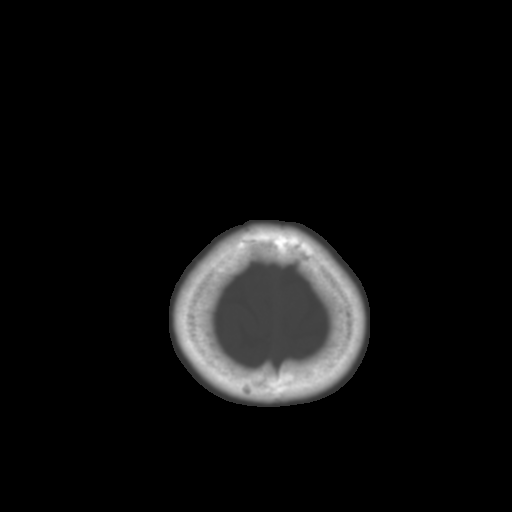

[Series 3: head bone · axial · 0.49mm/px · z∈[-13,+28]mm · 3 of 28 slices shown]
[im 2/28  bone]
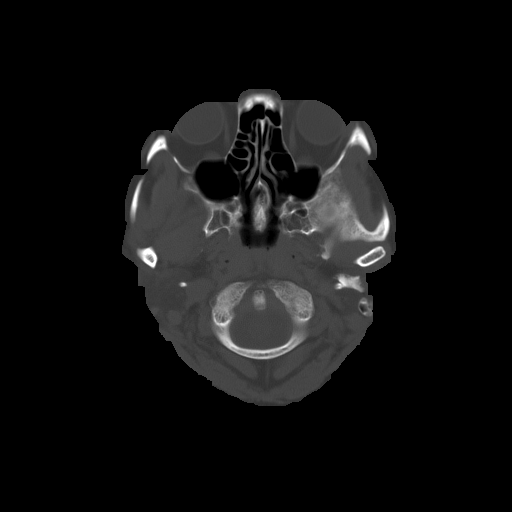
[im 6/28  bone]
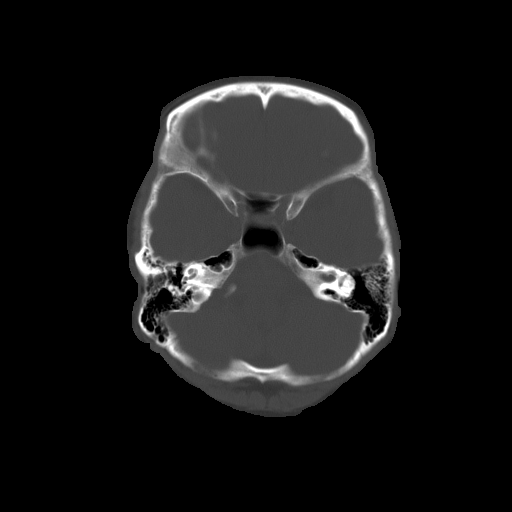
[im 10/28  bone]
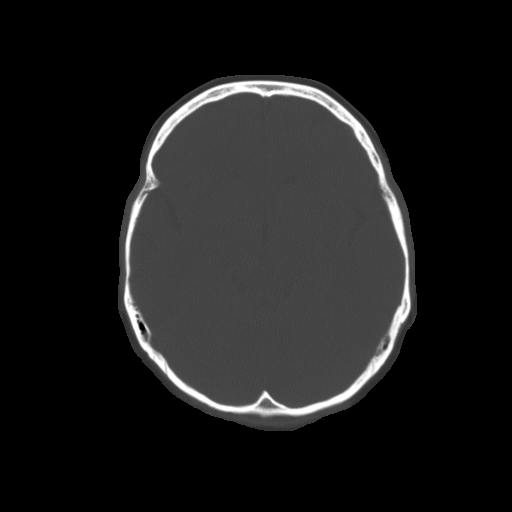

[16 of 30 positions shown; findings below may reference images not displayed]

FINDINGS: The brain has a normal appearance without evidence of
malformation, atrophy, old or acute infarction, mass lesion,
hemorrhage, hydrocephalus or extra-axial collection.  Visualized
sinuses, middle ears and mastoids are clear.  The calvarium is
unremarkable.
IMPRESSION: Normal head CT

## 2014-03-24 ENCOUNTER — Other Ambulatory Visit: Payer: PRIVATE HEALTH INSURANCE

## 2014-03-24 ENCOUNTER — Ambulatory Visit: Payer: PRIVATE HEALTH INSURANCE

## 2014-03-31 ENCOUNTER — Other Ambulatory Visit: Payer: Self-pay

## 2014-03-31 ENCOUNTER — Ambulatory Visit (HOSPITAL_BASED_OUTPATIENT_CLINIC_OR_DEPARTMENT_OTHER): Payer: Self-pay

## 2014-03-31 VITALS — BP 120/90 | HR 70 | Temp 97.5°F | Resp 16 | Ht 62.5 in | Wt 136.2 lb

## 2014-03-31 DIAGNOSIS — Z0000 Encounter for general adult medical examination without abnormal findings: Secondary | ICD-10-CM

## 2014-03-31 LAB — LIPID PANEL
CHOLESTEROL: 193 mg/dL (ref 0–200)
HDL: 45 mg/dL (ref >39)
LDL Cholesterol: 117 mg/dL — ABNORMAL HIGH (ref 0–99)
TRIGLYCERIDES: 157 mg/dL — AB (ref <150)
Total CHOL/HDL Ratio: 4.3 Ratio
VLDL: 31 mg/dL (ref 0–40)

## 2014-03-31 LAB — HEMOGLOBIN A1C
HEMOGLOBIN A1C: 5.9 % — AB (ref <5.7)
Mean Plasma Glucose: 123 mg/dL — ABNORMAL HIGH (ref <117)

## 2014-03-31 LAB — GLUCOSE (CC13): GLUCOSE: 105 mg/dL (ref 70–140)

## 2014-03-31 NOTE — Patient Instructions (Signed)
Discussed health assessment with patient. She will be called with results of lab work and we will then discussed any further follow up the patient needs. Patient will implement behavior modifications to help lower BP. Patient verbalized understanding. 

## 2014-03-31 NOTE — Progress Notes (Signed)
Patient [facility] and is currently a [facility] patient effective 11/22/2013.  Clinical Measurements: Patient is 5 ft. 6 inches, weight 169.8 lbs, waist circumference 35 inches, and hip circumference 40.75inches.   Medical History: Patient has no history of high cholesterol. Patient does not have a history of hypertension or diabetes. Per patient no diagnosed history of coronary heart disease, heart attack, heart failure, stroke/TIA, vascular disease or congenital heart defects.   Blood Pressure, Self-measurement: Patient states has no reason to check Blood pressure.  Nutrition Assessment: Patient stated that eats 1-2 fruits every day. Patient stated that usually eats a large apple or orange each day.Patient states she eats four servings of vegetables a day. She does not eat 3 or more ounces of whole grains daily. Patient doesn't eat two or more servings of fish weekly. Patient states she does not drink more than 36 ounces or 450 calories of beverages with added sugars weekly. Patient stated she does watch her salt intake.   Physical Activity Assessment: Patient stated she walks 4 to 5 miles 5 days a week, does one hour of household cleaning a day and mows the yard once a week. Per patient swims two miles once a week.  Smoking Status: Patient had never smoked and is not around people who smoke.  Quality of Life Assessment: In assessing patient's quality of life she stated that out of the past 30 days that she has felt her health is good all of them. Patient also stated that in the past 30 days that her mental health is not good including stress, depression and problems with emotions for 3 days. Patient did state that out of the past 30 days she felt her physical or mental health had not kept her from doing her usual activities including self-care, work or recreation.   Plan: Lab work will be done today including a lipid panel, blood glucose, and Hgb A1C. Will call lab  results when they are finished. Patient will returned  for Health Coaching and BP check. Will work on behavior modifications that we discussed to lower diastolic BP.

## 2014-04-04 ENCOUNTER — Telehealth: Payer: Self-pay

## 2014-04-04 NOTE — Telephone Encounter (Signed)
Called patient to inform of lab findings and reached voice mail. Left message to call me on Wednesday when gets off work or I will attempt to reach her before leaving on Wednesday.

## 2014-04-05 ENCOUNTER — Telehealth: Payer: Self-pay

## 2014-04-05 NOTE — Telephone Encounter (Signed)
Returned the patients return call to office and informed of lab results. Explained to patient that her A1C was 5.9 which is border line for diabetes. Discussed that needed to reduce number of carbohydrates and eat complex carbohydrates which means higher in fiber count. [facility] May 20 th at 9 am. Patient stated that had to work that day and she was off on 5/13 could it be on that day. [facility] and there was no appointment available on the 13 th. Called patient back and left voice mail that she could call and check next week to see if anyone had cancelled for the 5/13 to change appointment. Otherwise patient needs to make arrangements for 5/20 or change appointment to meet her schedule needs.

## 2014-04-10 ENCOUNTER — Encounter (HOSPITAL_COMMUNITY): Payer: Self-pay | Admitting: *Deleted

## 2014-04-26 ENCOUNTER — Ambulatory Visit: Payer: Self-pay | Attending: Internal Medicine | Admitting: Internal Medicine

## 2014-04-26 ENCOUNTER — Ambulatory Visit: Payer: Self-pay

## 2014-04-26 VITALS — BP 120/88

## 2014-04-26 VITALS — BP 121/83 | HR 62 | Temp 99.0°F | Resp 16 | Ht 62.5 in | Wt 135.0 lb

## 2014-04-26 DIAGNOSIS — Z0000 Encounter for general adult medical examination without abnormal findings: Secondary | ICD-10-CM | POA: Insufficient documentation

## 2014-04-26 DIAGNOSIS — Z789 Other specified health status: Secondary | ICD-10-CM

## 2014-04-26 LAB — TSH: TSH: 0.967 u[IU]/mL (ref 0.350–4.500)

## 2014-04-26 LAB — CBC WITH DIFFERENTIAL/PLATELET
BASOS ABS: 0.1 10*3/uL (ref 0.0–0.1)
Basophils Relative: 1 % (ref 0–1)
Eosinophils Absolute: 0.2 10*3/uL (ref 0.0–0.7)
Eosinophils Relative: 3 % (ref 0–5)
HEMATOCRIT: 43.3 % (ref 36.0–46.0)
HEMOGLOBIN: 14.7 g/dL (ref 12.0–15.0)
LYMPHS ABS: 1.5 10*3/uL (ref 0.7–4.0)
LYMPHS PCT: 24 % (ref 12–46)
MCH: 30.2 pg (ref 26.0–34.0)
MCHC: 33.9 g/dL (ref 30.0–36.0)
MCV: 89.1 fL (ref 78.0–100.0)
MONO ABS: 0.4 10*3/uL (ref 0.1–1.0)
Monocytes Relative: 6 % (ref 3–12)
NEUTROS ABS: 4.2 10*3/uL (ref 1.7–7.7)
Neutrophils Relative %: 66 % (ref 43–77)
Platelets: 258 10*3/uL (ref 150–400)
RBC: 4.86 MIL/uL (ref 3.87–5.11)
RDW: 13.4 % (ref 11.5–15.5)
WBC: 6.4 10*3/uL (ref 4.0–10.5)

## 2014-04-26 LAB — COMPREHENSIVE METABOLIC PANEL
ALT: 16 U/L (ref 0–35)
AST: 17 U/L (ref 0–37)
Albumin: 4.5 g/dL (ref 3.5–5.2)
Alkaline Phosphatase: 86 U/L (ref 39–117)
BILIRUBIN TOTAL: 0.9 mg/dL (ref 0.2–1.2)
BUN: 9 mg/dL (ref 6–23)
CALCIUM: 9.6 mg/dL (ref 8.4–10.5)
CHLORIDE: 104 meq/L (ref 96–112)
CO2: 27 meq/L (ref 19–32)
CREATININE: 0.74 mg/dL (ref 0.50–1.10)
GLUCOSE: 85 mg/dL (ref 70–99)
Potassium: 4.4 mEq/L (ref 3.5–5.3)
Sodium: 139 mEq/L (ref 135–145)
Total Protein: 7.1 g/dL (ref 6.0–8.3)

## 2014-04-26 NOTE — Progress Notes (Signed)
Patient ID: Tonya Holder, female   DOB: 16-Apr-1961, 53 y.o.   MRN: [id number]   CC: physical exam   HPI: Pt is 53 yo female presenting for annual physical exam. She is doing well and has no specific concerns today, no chest pain or shortness of breath, no abdominal or urinary concerns.   No Known Allergies Past Medical History  Diagnosis Date  . Abnormal Pap smear    Current Outpatient Prescriptions on File Prior to Visit  Medication Sig Dispense Refill  . ibuprofen (ADVIL,MOTRIN) 200 MG tablet Take 200 mg by mouth every 6 (six) hours as needed.         No current facility-administered medications on file prior to visit.   Family History  Problem Relation Age of Onset  . Adopted: Yes  . Cancer Mother     brain cancer  . Hypertension Father   . Heart disease Father   . Cancer Father     liver cancer   History   Social History  . Marital Status: Single    Spouse Name: N/A    Number of Children: N/A  . 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 Smoking status: Never Smoker   . Smokeless tobacco: Never Used  . Alcohol Use: No  . Drug Use: No  . Sexual Activity: Not Currently   Other Topics Concern  . Not on file   Social History Narrative  . No narrative on file    Review of Systems  Constitutional: Negative for fever, chills, diaphoresis, activity change, appetite change and fatigue.  HENT: Negative for ear pain, nosebleeds, congestion, facial swelling, rhinorrhea, neck pain, neck stiffness and ear discharge.   Eyes: Negative for pain, discharge, redness, itching and visual disturbance.  Respiratory: Negative for cough, choking, chest tightness, shortness of breath, wheezing and stridor.   Cardiovascular: Negative for chest pain, palpitations and leg swelling.  Gastrointestinal: Negative for abdominal distention.  Genitourinary: Negative for dysuria, urgency, frequency, hematuria, flank pain, decreased  urine volume, difficulty urinating and dyspareunia.  Musculoskeletal: Negative for back pain, joint swelling, arthralgias and gait problem.  Neurological: Negative for dizziness, tremors, seizures, syncope, facial asymmetry, speech difficulty, weakness, light-headedness, numbness and headaches.  Hematological: Negative for adenopathy. Does not bruise/bleed easily.  Psychiatric/Behavioral: Negative for hallucinations, behavioral problems, confusion, dysphoric mood, decreased concentration and agitation.    Objective:   Filed Vitals:   04/26/14 0910  BP: 121/83  Pulse: 62  Temp: 99 F (37.2 C)  Resp: 16    Physical Exam  Constitutional: Appears well-developed and well-nourished. No distress.  HENT: Normocephalic. External right and left ear normal. Oropharynx is clear and moist.  Eyes: Conjunctivae and EOM are normal. PERRLA, no scleral icterus.  Neck: Normal ROM. Neck supple. No JVD. No tracheal deviation. No thyromegaly.  CVS: RRR, S1/S2 +, no murmurs, no gallops, no carotid bruit.  Pulmonary: Effort and breath sounds normal, no stridor, rhonchi, wheezes, rales.  Abdominal: Soft. BS +,  no distension, tenderness, rebound or guarding.  Musculoskeletal: Normal range of motion. No edema and no tenderness.  Lymphadenopathy: No lymphadenopathy noted, cervical, inguinal. Neuro: Alert. Normal reflexes, muscle tone coordination. No cranial nerve deficit. Skin: Skin is warm and dry. No rash noted. Not diaphoretic. No erythema. No pallor.  Psychiatric: Normal mood and affect. Behavior, judgment, thought content normal.   No results found for this basename: WBC, HGB, HCT, MCV, PLT   No results  found for this basename: CREATININE, BUN, NA, K, CL, CO2    Lab Results  Component Value Date   HGBA1C 5.9* 03/31/2014   Lipid Panel     Component Value Date/Time   CHOL 193 03/31/2014 0814   TRIG 157* 03/31/2014 0814   HDL 45 03/31/2014 0814   CHOLHDL 4.3 03/31/2014 0814   VLDL 31 03/31/2014  0814   LDLCALC 117* 03/31/2014 0814       Assessment and plan:   Patient Active Problem List   Diagnosis Date Noted  . Physical exam - pt doing well - A1C is 5.9 which is normal, discussed recommendations on diet and exercise - no need for medical regimen at this time - repeat A1C in 3 months if needed 02/04/2013

## 2014-04-26 NOTE — Patient Instructions (Signed)
Exercise to Stay Healthy Exercise helps you become and stay healthy. EXERCISE IDEAS AND TIPS Choose exercises that:  You enjoy.  Fit into your day. You do not need to exercise really hard to be healthy. You can do exercises at a slow or medium level and stay healthy. You can:  Stretch before and after working out.  Try yoga, Pilates, or tai chi.  Lift weights.  Walk fast, swim, jog, run, climb stairs, bicycle, dance, or rollerskate.  Take aerobic classes. Exercises that burn about 150 calories:  Running 1  miles in 15 minutes.  Playing volleyball for 45 to 60 minutes.  Washing and waxing a car for 45 to 60 minutes.  Playing touch football for 45 minutes.  Walking 1  miles in 35 minutes.  Pushing a stroller 1  miles in 30 minutes.  Playing basketball for 30 minutes.  Raking leaves for 30 minutes.  Bicycling 5 miles in 30 minutes.  Walking 2 miles in 30 minutes.  Dancing for 30 minutes.  Shoveling snow for 15 minutes.  Swimming laps for 20 minutes.  Walking up stairs for 15 minutes.  Bicycling 4 miles in 15 minutes.  Gardening for 30 to 45 minutes.  Jumping rope for 15 minutes.  Washing windows or floors for 45 to 60 minutes. Document Released: 12/27/2010 Document Revised: 02/16/2012 Document Reviewed: 12/27/2010 ExitCare Patient Information 2014 [employer].  

## 2014-04-26 NOTE — Progress Notes (Signed)
Pt is here to establish care. Pt states that she was referred her for further testing about her prediabetes.  Pt is requesting to have annual exams.

## 2014-04-27 ENCOUNTER — Telehealth: Payer: Self-pay | Admitting: *Deleted

## 2014-04-27 NOTE — Progress Notes (Signed)
Patient returns today for Health Coaching regarding Nutrition for her borderline AIC.   NUTRITION: Patient has appointment [facility] today at 9 am.Discussed watching carbohydrates, how to count them, serving sizes and number of carbs per day allowance. Patient received and reviewed the following handouts : t My Plate, How to counrCarbohydrates and Carb counting Menu. Gave patient measuring cup to measure serving sizes and demonstrated the serving sizes. Received and went over [employer]. . .  PLAN: Decrease carbohydrates in diet

## 2014-04-27 NOTE — Telephone Encounter (Signed)
Patient calling wanting lab results from 04/26/2014. Informed patient all lab work was normal. Patient requested a copy be mailed to her. Reather LaurenceJamie R Jaylen Knope, RN

## 2014-04-27 NOTE — Patient Instructions (Signed)
Will measure portion sizes. Will mix half white and brown rice to increase fiber. Will decrease amount of rice eating. Will keep count of carbohydrates per day. Verbalized understanding.

## 2014-06-07 ENCOUNTER — Telehealth: Payer: Self-pay

## 2014-06-07 NOTE — Telephone Encounter (Signed)
Called to ask about scheduling pap smear and to ask questions concerning carbohydrates. Discussed problem areas and patient voiced understanding.

## 2014-06-16 ENCOUNTER — Ambulatory Visit: Payer: Self-pay

## 2014-06-20 ENCOUNTER — Ambulatory Visit (HOSPITAL_COMMUNITY): Payer: Self-pay

## 2014-06-20 ENCOUNTER — Ambulatory Visit (HOSPITAL_COMMUNITY)
Admission: RE | Admit: 2014-06-20 | Discharge: 2014-06-20 | Disposition: A | Discharge status: Home / Self Care | Payer: Self-pay | Source: Ambulatory Visit | Attending: Obstetrics and Gynecology | Admitting: Obstetrics and Gynecology

## 2014-06-20 ENCOUNTER — Encounter (HOSPITAL_COMMUNITY): Payer: Self-pay

## 2014-06-20 VITALS — BP 116/76 | Temp 98.7°F | Ht 63.0 in | Wt 132.6 lb

## 2014-06-20 DIAGNOSIS — Z01419 Encounter for gynecological examination (general) (routine) without abnormal findings: Secondary | ICD-10-CM

## 2014-06-20 NOTE — Progress Notes (Signed)
No complaints today.  Pap Smear:  Completed Pap smear today. Patients [facility] and normal. Her prior two Pap smears were abnormal. Her Pap smear 04/02/2012 was LSIL. A follow up colposcopy was completed 05/14/2012 showing CIN-I and cryotherapy was done for follow up. [facility] ASCUS HPV+ and colpsocopy was completed 10/02/2011 showing CIN-I. Pap smear results above are in EPIC.   Pelvic/Bimanual   Ext Genitalia No lesions, no swelling and no discharge observed on external genitalia.         Vagina Vagina pink and normal texture. No lesions or discharge observed in vagina.          Cervix Cervix is present. Cervix pink and of normal texture. Cervix friable. No discharge observed on cervix.      Uterus Uterus is present and palpable. Uterus in normal position and normal size.       Adnexae Bilateral ovaries present and palpable. No tenderness on palpation.        Rectovaginal No rectal exam completed today since patient had no rectal complaints. Small hemorrhoid observed on rectal area.

## 2014-06-20 NOTE — Patient Instructions (Signed)
Let her know [facility] will cover Pap smears every 3 years unless has a history of abnormal Pap smears. Let patient know will follow up with her within the next couple weeks with results of Pap smear by phone. Told patient that her screening mammogram is due in December 2015 and that she will need to schedule an appointment with [facility] prior to appointment to renew if still eligible. Tonya ConnersJane Holder verbalized understanding.  Brannock, Kathaleen Maserhristine Poll, RN 4:04 PM

## 2014-06-21 LAB — CYTOLOGY - PAP

## 2014-06-22 ENCOUNTER — Telehealth (HOSPITAL_COMMUNITY): Payer: Self-pay | Admitting: *Deleted

## 2014-06-22 NOTE — Telephone Encounter (Signed)
Telephoned patient at home # and advised of normal pap smear results. Next pap smear due in one year. Patient voiced understanding. Patient will call in November to renew [facility] card.

## 2014-07-17 ENCOUNTER — Telehealth: Payer: Self-pay

## 2014-07-17 NOTE — Telephone Encounter (Signed)
Patient voices concern about lab bill that she keeps receiving from Apr 26, 2014. Fonnie MuChristine Brannock stated that it had been taken care of but patient keeps receiving a bill. PLAN: Patient will mail the bill to [employer]ine Brannock.  NUTRITION: Patient states that she is following the [employer] Plan and using hand outs. Per patient states she is exercising more.  PLAN: Will return in a year (May 2016) for revisit with labs.

## 2014-09-18 ENCOUNTER — Telehealth: Payer: Self-pay

## 2014-09-18 NOTE — Telephone Encounter (Signed)
Patient called about still getting a bill. Checked account and all is paid for that she has sent us. In reviewing account numbers it is not an account that she has faxed to the office.  PLAN: Patient will fax bill to office and we will try and figure out the problem and get back with patient.

## 2014-10-09 ENCOUNTER — Encounter (HOSPITAL_COMMUNITY): Payer: Self-pay

## 2014-12-06 ENCOUNTER — Telehealth: Payer: Self-pay

## 2014-12-06 NOTE — Telephone Encounter (Signed)
Called to reassess screening. Patient has increased moderate activity to 1260 minutes a week and cut sugar out of diet. Will do Annual re screen in Spring to early summer

## 2014-12-14 ENCOUNTER — Ambulatory Visit: Payer: Self-pay

## 2014-12-14 DIAGNOSIS — Z0000 Encounter for general adult medical examination without abnormal findings: Secondary | ICD-10-CM

## 2014-12-14 LAB — LIPID PANEL
CHOL/HDL RATIO: 3.7 ratio
Cholesterol: 201 mg/dL — ABNORMAL HIGH (ref 0–200)
HDL: 55 mg/dL (ref >39)
LDL Cholesterol: 127 mg/dL — ABNORMAL HIGH (ref 0–99)
Triglycerides: 97 mg/dL (ref <150)
VLDL: 19 mg/dL (ref 0–40)

## 2014-12-14 LAB — HEMOGLOBIN A1C
Hgb A1c MFr Bld: 5.9 % — ABNORMAL HIGH (ref <5.7)
Mean Plasma Glucose: 123 mg/dL — ABNORMAL HIGH (ref <117)

## 2014-12-14 NOTE — Patient Instructions (Signed)
Call [facility] to schedule mammogram ans PAP Smear. Will call lab

## 2014-12-14 NOTE — Progress Notes (Signed)
Patient has not has labs rechecked by doctor's office. Patient came to see if changes that she has made in her lifestyle have helped bring down her labs numbers.

## 2014-12-15 ENCOUNTER — Telehealth: Payer: Self-pay

## 2014-12-15 NOTE — Telephone Encounter (Signed)
Had labs rechecked because past 3 to 6 months to check Hgb A1C.Called to inform about lab work from 12/15/63.I informed patient: cholesterol- 201 (5/15- 193), HDL- 55 (45), LDL-127 (117), triglycerides - 97 (157), and HBG-A1C - 5.9 (5.9).  PLAN: Will re screen with labs between May and July of 2016 if does not get insurance. Will mail lab results and information about nutrition.

## 2014-12-26 ENCOUNTER — Other Ambulatory Visit: Payer: Self-pay | Admitting: Obstetrics and Gynecology

## 2014-12-26 DIAGNOSIS — Z1231 Encounter for screening mammogram for malignant neoplasm of breast: Secondary | ICD-10-CM

## 2015-01-11 ENCOUNTER — Encounter (HOSPITAL_COMMUNITY): Payer: Self-pay

## 2015-01-11 ENCOUNTER — Ambulatory Visit (HOSPITAL_COMMUNITY)
Admission: RE | Admit: 2015-01-11 | Discharge: 2015-01-11 | Disposition: A | Discharge status: Home / Self Care | Payer: Self-pay | Source: Ambulatory Visit | Attending: Obstetrics and Gynecology | Admitting: Obstetrics and Gynecology

## 2015-01-11 VITALS — BP 108/64 | Temp 97.4°F | Ht 62.5 in | Wt 126.8 lb

## 2015-01-11 DIAGNOSIS — Z1231 Encounter for screening mammogram for malignant neoplasm of breast: Secondary | ICD-10-CM | POA: Insufficient documentation

## 2015-01-11 DIAGNOSIS — Z01419 Encounter for gynecological examination (general) (routine) without abnormal findings: Secondary | ICD-10-CM

## 2015-01-11 NOTE — Patient Instructions (Signed)
Explained to Tonya Holder that she did not need a Pap smear today due to last Pap smear was 06/20/2014. Let her know that her next Pap smear is due in July due to her recent history of abnormal Pap smears. Told patient to call [name] to schedule an appointment with [facility] closer to July. Let [facility] know [facility] with her within the next couple of weeks with results to mammogram by letter or phone. Tonya ConnersJane Holder verbalized understanding. Patient escorted to mammography for a screening mammogram.  Arbadella Kimbler, Kathaleen Maserhristine Poll, RN 8:40 AM

## 2015-01-11 NOTE — Progress Notes (Signed)
No complaints today.  Pap Smear:  Pap smear not completed today. [facility]. Patient has a history of two abnormal Pap smears 04/02/2012 LSIL and 08/19/2011 ASCUS HPV+. 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 Patient has had two normal Pap smears since the abnormal Pap smear. Patients next Pap smear is due in July. Pap smear and colposcopy results above are in EPIC.   Physical exam: Breasts Breasts symmetrical. No skin abnormalities bilateral breasts. No nipple retraction bilateral breasts. No nipple discharge bilateral breasts. No lymphadenopathy. No lumps palpated bilateral breasts. No complaints of pain or tenderness on exam. Patient escorted to mammography for a screening mammogram.        Pelvic/Bimanual No Pap smear completed today since last Pap smear was 06/20/2014. Pap smear not indicated per [facility] guidelines.

## 2015-08-07 ENCOUNTER — Ambulatory Visit (HOSPITAL_BASED_OUTPATIENT_CLINIC_OR_DEPARTMENT_OTHER): Payer: Self-pay

## 2015-08-07 ENCOUNTER — Other Ambulatory Visit: Payer: Self-pay

## 2015-08-07 VITALS — BP 100/70 | HR 74 | Temp 98.4°F | Resp 16 | Ht 63.5 in | Wt 123.3 lb

## 2015-08-07 DIAGNOSIS — Z0000 Encounter for general adult medical examination without abnormal findings: Secondary | ICD-10-CM

## 2015-08-07 LAB — LIPID PANEL
CHOL/HDL RATIO: 3.6 ratio (ref <=5.0)
Cholesterol: 209 mg/dL — ABNORMAL HIGH (ref 125–200)
HDL: 58 mg/dL (ref >=46)
LDL CALC: 135 mg/dL — AB (ref <130)
Triglycerides: 80 mg/dL (ref <150)
VLDL: 16 mg/dL (ref <30)

## 2015-08-07 LAB — GLUCOSE (CC13): GLUCOSE: 93 mg/dL (ref 70–140)

## 2015-08-07 LAB — HEMOGLOBIN A1C
Hgb A1c MFr Bld: 5.8 % — ABNORMAL HIGH (ref <5.7)
Mean Plasma Glucose: 120 mg/dL — ABNORMAL HIGH (ref <117)

## 2015-08-07 NOTE — Patient Instructions (Signed)
Will measure portion sizes. Will mix half white and brown rice to increase fiber. Will decrease amount of rice eating. Will keep count of carbohydrates per day. Verbalized understanding. 

## 2015-08-07 NOTE — Progress Notes (Signed)
Patient is in the [facility] program and here for re-screening. Patient is currently a [facility] patient effective 01/11/2015.   Clinical Measurements: Patient is 5 ft. 3.5 inches, weight 123.2 lbs..   Medical History: Patient has no history of high cholesterol. Patient does not have a history of hypertension or diabetes. Per patient no diagnosed history of coronary heart disease, heart attack, heart failure, stroke/TIA, vascular disease or congenital heart defects.   Blood Pressure, Self-measurement: Patient states has no reason to check Blood pressure.  Nutrition Assessment: Patient stated that eats 2 fruits every day. Patient states she eats 4 to 6 servings of vegetables a day. Per patient eats 3 or more ounces of whole grains daily. Patient does eat two or more servings of fish weekly. Patient states she does not drink more than 36 ounces or 450 calories of beverages with added sugars weekly. Patient states she drinks a lot of water. Patient stated she does watch her salt intake.   Physical Activity Assessment: Patient stated she usually walks every day for 3 to 5 miles, does rock climbing and swims 2 miles every week. Per patient around 720 minutes moderate exercise and 60 minutes of vigorous exercise per week.  Smoking Status: Patient had never smoked and is no exposed to smoke.  Quality of Life Assessment: In assessing patient's quality of life she stated that out of the past 30 days that she has felt her health is good all of them. Patient also stated that in the past 30 days that her mental health is was good including stress, depression and problems with emotions for all days. Patient did state that out of the past 30 days she felt her physical or mental health had not kept her from doing her usual activities including self-care, work or recreation.   Plan: Lab work will be done today including a lipid panel, blood glucose, and Hgb A1C. Will call lab results when they are finished. Will do risk  reduction counseling if needed.

## 2015-08-09 ENCOUNTER — Ambulatory Visit (HOSPITAL_COMMUNITY): Payer: Self-pay

## 2015-08-09 ENCOUNTER — Ambulatory Visit: Payer: Self-pay

## 2015-08-10 ENCOUNTER — Ambulatory Visit: Payer: Self-pay

## 2015-08-14 ENCOUNTER — Telehealth: Payer: Self-pay

## 2015-08-14 NOTE — Telephone Encounter (Signed)
Called to inform about lab work from 08/07/15. I told patient: cholesterol- 209, HDL- 58, LDL- 135, triglycerides - 80, Bld Glucose -93 and HBG-AiC - 5.8. Discussed that had been in [place] for 10 weeks prior to this and could not eat like was at home especially  Fiber foods. Will follow up in month. Patient stated does not think needs more health coaching. Patient states is getting back to eating like is suppose to.

## 2015-10-31 ENCOUNTER — Telehealth: Payer: Self-pay

## 2015-10-31 NOTE — Telephone Encounter (Signed)
Patient stated that is doing well with exercise and eating . Per patient has found [facility] program where she is. Patient does not desire any further Health Coaching at the present time.

## 2015-11-05 ENCOUNTER — Telehealth (HOSPITAL_COMMUNITY): Payer: Self-pay

## 2015-11-05 NOTE — Telephone Encounter (Signed)
Returned call concerning [employer]. Patient stated that was doing good and continuing her exercise and eating properly. Per patient stated that was getting a job at [employer], Patient voiced that did not need any further follow up at the present time.

## 2016-06-24 ENCOUNTER — Encounter (HOSPITAL_COMMUNITY): Payer: Self-pay | Admitting: *Deleted

## 2017-09-15 ENCOUNTER — Encounter (HOSPITAL_COMMUNITY): Payer: Self-pay

## 2017-12-16 ENCOUNTER — Encounter (HOSPITAL_COMMUNITY): Payer: Self-pay
